# Patient Record
Sex: Female | Born: 1995 | Race: Black or African American | Hispanic: No | Marital: Single
Health system: Southern US, Community
[De-identification: ages and names within clinical notes are randomized; demographics above are authoritative.]

## PROBLEM LIST (undated history)

## (undated) ENCOUNTER — Emergency Department (HOSPITAL_COMMUNITY): Payer: Managed Care, Other (non HMO) | Source: Home / Self Care

---

## 2016-03-25 ENCOUNTER — Other Ambulatory Visit: Payer: Self-pay | Admitting: Family Medicine

## 2016-03-25 ENCOUNTER — Other Ambulatory Visit (HOSPITAL_COMMUNITY)
Admission: RE | Admit: 2016-03-25 | Discharge: 2016-03-25 | Disposition: A | Discharge status: Home / Self Care | Payer: Managed Care, Other (non HMO) | Source: Ambulatory Visit | Attending: Family Medicine | Admitting: Family Medicine

## 2016-03-25 DIAGNOSIS — Z01419 Encounter for gynecological examination (general) (routine) without abnormal findings: Secondary | ICD-10-CM | POA: Diagnosis present

## 2016-03-29 LAB — CYTOLOGY - PAP

## 2018-08-07 ENCOUNTER — Emergency Department (HOSPITAL_COMMUNITY): Payer: Managed Care, Other (non HMO)

## 2018-08-07 ENCOUNTER — Encounter (HOSPITAL_COMMUNITY): Payer: Self-pay

## 2018-08-07 ENCOUNTER — Emergency Department (HOSPITAL_COMMUNITY)
Admission: EM | Admit: 2018-08-07 | Discharge: 2018-08-07 | Disposition: A | Discharge status: Home / Self Care | Payer: Managed Care, Other (non HMO) | Attending: Emergency Medicine | Admitting: Emergency Medicine

## 2018-08-07 DIAGNOSIS — Y92411 Interstate highway as the place of occurrence of the external cause: Secondary | ICD-10-CM | POA: Insufficient documentation

## 2018-08-07 DIAGNOSIS — M62838 Other muscle spasm: Secondary | ICD-10-CM | POA: Diagnosis not present

## 2018-08-07 DIAGNOSIS — Y9389 Activity, other specified: Secondary | ICD-10-CM | POA: Diagnosis not present

## 2018-08-07 DIAGNOSIS — M25562 Pain in left knee: Secondary | ICD-10-CM

## 2018-08-07 DIAGNOSIS — Y999 Unspecified external cause status: Secondary | ICD-10-CM | POA: Diagnosis not present

## 2018-08-07 MED ORDER — NAPROXEN 500 MG PO TABS: 500.0000 mg | ORAL_TABLET | Freq: Two times a day (BID) | ORAL | 0 refills | Status: AC

## 2018-08-07 MED ORDER — CYCLOBENZAPRINE HCL 10 MG PO TABS: 10.0000 mg | ORAL_TABLET | Freq: Two times a day (BID) | ORAL | 0 refills | Status: AC | PRN
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
## 2018-08-07 NOTE — Discharge Instructions (Addendum)
Apply warm compresses to sore muscles for 20 minutes at a time.  Take Flexeril and naproxen as needed as prescribed.  Do not drive or operate machinery if taking Flexeril. Follow-up with primary care provider, referral given if needed.  Return to ER for worsening or concerning symptoms.
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
## 2018-08-15 ENCOUNTER — Ambulatory Visit: Payer: Managed Care, Other (non HMO) | Attending: Critical Care Medicine | Admitting: Critical Care Medicine

## 2018-08-15 ENCOUNTER — Encounter: Payer: Self-pay | Admitting: Critical Care Medicine

## 2018-08-15 DIAGNOSIS — M5489 Other dorsalgia: Secondary | ICD-10-CM

## 2018-08-15 DIAGNOSIS — M549 Dorsalgia, unspecified: Secondary | ICD-10-CM | POA: Insufficient documentation

## 2018-08-15 DIAGNOSIS — M546 Pain in thoracic spine: Secondary | ICD-10-CM | POA: Insufficient documentation

## 2018-08-15 DIAGNOSIS — R51 Headache: Secondary | ICD-10-CM

## 2018-08-15 DIAGNOSIS — M542 Cervicalgia: Secondary | ICD-10-CM | POA: Insufficient documentation
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
## 2018-08-15 NOTE — Patient Instructions (Signed)
Continue to use the Naprosyn on a twice daily basis until current prescription runs out  You may use the Flexeril as needed  No additional imaging was recommended  Return as needed if symptoms were to worsen
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
